# Patient Record
Sex: Male | Born: 1960 | Race: White | Hispanic: No | Marital: Married | Smoking: Never smoker
Health system: Southern US, Community
[De-identification: ages and names within clinical notes are randomized; demographics above are authoritative.]

## PROBLEM LIST (undated history)

## (undated) DIAGNOSIS — R030 Elevated blood-pressure reading, without diagnosis of hypertension: Secondary | ICD-10-CM

## (undated) DIAGNOSIS — E785 Hyperlipidemia, unspecified: Secondary | ICD-10-CM

## (undated) DIAGNOSIS — I10 Essential (primary) hypertension: Secondary | ICD-10-CM

## (undated) HISTORY — DX: Essential (primary) hypertension: I10

## (undated) HISTORY — DX: Hyperlipidemia, unspecified: E78.5

## (undated) HISTORY — PX: OTHER SURGICAL HISTORY: SHX169

---

## 2001-02-01 ENCOUNTER — Inpatient Hospital Stay (HOSPITAL_COMMUNITY): Admission: EM | Admit: 2001-02-01 | Discharge: 2001-02-02 | Payer: Self-pay | Admitting: Emergency Medicine

## 2001-02-01 ENCOUNTER — Encounter: Payer: Self-pay | Admitting: Emergency Medicine

## 2001-02-02 ENCOUNTER — Encounter: Payer: Self-pay | Admitting: Surgery

## 2001-02-09 ENCOUNTER — Ambulatory Visit (HOSPITAL_COMMUNITY): Admission: RE | Admit: 2001-02-09 | Discharge: 2001-02-09 | Payer: Self-pay | Admitting: Orthopaedic Surgery

## 2009-08-23 ENCOUNTER — Ambulatory Visit: Payer: Self-pay | Admitting: Interventional Radiology

## 2009-08-23 ENCOUNTER — Emergency Department (HOSPITAL_BASED_OUTPATIENT_CLINIC_OR_DEPARTMENT_OTHER): Admission: EM | Admit: 2009-08-23 | Discharge: 2009-08-23 | Payer: Self-pay | Admitting: Emergency Medicine

## 2011-03-25 NOTE — Op Note (Signed)
[facility]  Patient:    Garrett Rogers, Garrett Rogers                     MRN: [id number] Proc. Date: 02/09/01 Adm. Date:  [id number] Attending:  Jacki Cones                           Operative Report  PREOPERATIVE DIAGNOSIS:  Right distal radius styloid fracture.  POSTOPERATIVE DIAGNOSIS:  Right distal radius styloid fracture.  PROCEDURE:  Reduction and percutaneous screw fixation of distal radius styloid fracture.  SURGEON:  Mark C. Ophelia Charter, M.D.  ANESTHESIA:  General endotracheal.  TOURNIQUET TIME:  None.  DESCRIPTION OF PROCEDURE:  After general anesthesia, standard prepping and draping, with sterile gloves and lead shielding the patient and staff, the usual extremity sheets, drapes were applied.  C-arm was used after sterile draping for rotation of the wrist and visualization of the fracture site. Appropriate entry site for reduction was just volar to the first dorsal compartment.  A 1.5-2 cm incision was made, blunt dissection was performed, with mobilization to avoid damage to the vein as well as radial sensory nerve branches.  Once tiny retractors were placed and the distal radius was visualized, an 0.062 K-wire was drilled directly into the periosteum, into the bond fragment, and this was twisted, angled under fluoroscopy until the fracture was reduced nearly anatomic and then passed across the fracture.  A second pin, which was used for a 4.0 cannulated screw, was then placed more distal to this, drilled across the periosteum into the cortex, but not across the fracture site.  The first pin was then backed up and with the two pins in position, the fragment was rotated and gently reduced, checking under fluoroscopy until it was anatomic, and then the second pin, which was the pin for the cannulated screw, was then advanced across toward the opposite cortex. A 43 mm screw was selected.  The first 0.62 K-wire was removed and with  the fracture anatomic holding pressure, with tips of Adson pickups, the fracture was lagged across, compressed anatomically, and visualized under fluoroscopy and was anatomic.  After irrigation, two simple sutures were placed closing the skin, Xeroform, 4 x 4s, Webril, and the splint was reapplied to the wrist.  Instrument count and needle count were correct. DD:  02/09/01 TD:  02/09/01 Job: [id number]
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
## 2015-07-08 ENCOUNTER — Encounter (HOSPITAL_BASED_OUTPATIENT_CLINIC_OR_DEPARTMENT_OTHER): Payer: Self-pay | Admitting: Adult Health

## 2015-07-08 ENCOUNTER — Emergency Department (HOSPITAL_BASED_OUTPATIENT_CLINIC_OR_DEPARTMENT_OTHER)
Admission: EM | Admit: 2015-07-08 | Discharge: 2015-07-09 | Disposition: A | Discharge status: Home / Self Care | Payer: BLUE CROSS/BLUE SHIELD | Attending: Emergency Medicine | Admitting: Emergency Medicine

## 2015-07-08 ENCOUNTER — Emergency Department (HOSPITAL_BASED_OUTPATIENT_CLINIC_OR_DEPARTMENT_OTHER): Payer: BLUE CROSS/BLUE SHIELD

## 2015-07-08 DIAGNOSIS — R030 Elevated blood-pressure reading, without diagnosis of hypertension: Secondary | ICD-10-CM | POA: Insufficient documentation

## 2015-07-08 DIAGNOSIS — R51 Headache: Secondary | ICD-10-CM | POA: Insufficient documentation

## 2015-07-08 DIAGNOSIS — E669 Obesity, unspecified: Secondary | ICD-10-CM | POA: Insufficient documentation

## 2015-07-08 DIAGNOSIS — R519 Headache, unspecified: Secondary | ICD-10-CM

## 2015-07-08 HISTORY — DX: Elevated blood-pressure reading, without diagnosis of hypertension: R03.0

## 2015-07-08 LAB — URINALYSIS, ROUTINE W REFLEX MICROSCOPIC
Bilirubin Urine: NEGATIVE
Glucose, UA: NEGATIVE mg/dL
Hgb urine dipstick: NEGATIVE
Ketones, ur: NEGATIVE mg/dL
Leukocytes, UA: NEGATIVE
Nitrite: NEGATIVE
Protein, ur: NEGATIVE mg/dL
Specific Gravity, Urine: 1.01 (ref 1.005–1.030)
Urobilinogen, UA: 0.2 mg/dL (ref 0.0–1.0)
pH: 6.5 (ref 5.0–8.0)

## 2015-07-08 NOTE — ED Notes (Signed)
Pt c/o mild headache for the last three days with elevated BP.  Pt denies any SOB, denies chest pain or dizziness, no numbness or weakness or confusion.  Pt took Ibuprofen prior to arrival and verbalizes relief from head pain and declines anything further for pain.

## 2015-07-08 NOTE — ED Provider Notes (Signed)
CSN: [id number]     Arrival date & time 07/08/15  2111 History   This chart was scribed for Shon Baton, MD by Arlan Organ, ED Scribe. This patient was seen in room MH01/MH01 and the patient's care was started 11:17 PM.   Chief Complaint  Patient presents with  . Headache  . Hypertension   The history is provided by the patient. No language interpreter was used.    HPI Comments: Garrett Rogers is a 54 y.o. male with a PMHx of pre hypertension who presents to the Emergency Department complaining of an intermittent, ongoing, unchanged HA x 3 days. Pain is describes as "pressure to the back of my head" and rated pain 2/10 currently. Denies any history of HAs. OTC Ibuprofen attempted prior to arrival with mild improvement. Garrett Rogers also has some concern regarding his blood pressure as it was elevated at 150/90 earlier this afternoon. States he was diagnosed with pre-hypertension during last CPE. Pt admits to some sleep depravation in the last few days. He recently started a new job but denies any significant associated stress.  States he first noticed a headache on Sunday morning after he drank alcohol on Saturday night. Patient reports that he is most concerned because his mother had an aneurysm at age 59 and he is 64. He denies worst headache of his life or maximal headache at onset. Currently he denies any fever, chills, shortness of breath, dizziness, chest pain, or leg swelling. No weakness, numbness, or loss of sensation.  Past Medical History  Diagnosis Date  . Prehypertension    History reviewed. No pertinent past surgical history. History reviewed. No pertinent family history. Social History  Substance Use Topics  . Smoking status: Never Smoker   . Smokeless tobacco: None  . Alcohol Use: No    Review of Systems  Constitutional: Negative for fever and chills.  HENT: Negative for congestion.   Respiratory: Negative for cough and shortness of breath.   Cardiovascular:  Negative for chest pain and leg swelling.  Gastrointestinal: Negative for nausea, vomiting and abdominal pain.  Neurological: Positive for headaches. Negative for dizziness, weakness and numbness.  Psychiatric/Behavioral: Negative for confusion.  All other systems reviewed and are negative.     Allergies  Review of patient's allergies indicates no known allergies.  Home Medications   Prior to Admission medications   Not on File   Triage Vitals: BP 144/83 mmHg  Pulse 60  Temp(Src) 98.5 F (36.9 C) (Oral)  Resp 16  Ht 6' (1.829 m)  Wt 306 lb 2 oz (138.857 kg)  BMI 41.51 kg/m2  SpO2 98%   Physical Exam  Constitutional: He is oriented to person, place, and time. He appears well-developed and well-nourished.  Obese  HENT:  Head: Normocephalic and atraumatic.  Eyes: Pupils are equal, round, and reactive to light.  Cardiovascular: Normal rate, regular rhythm and normal heart sounds.   No murmur heard. Pulmonary/Chest: Effort normal and breath sounds normal. No respiratory distress. He has no wheezes.  Abdominal: Soft. Bowel sounds are normal. There is no tenderness. There is no rebound.  Musculoskeletal: He exhibits no edema.  Neurological: He is alert and oriented to person, place, and time.  Cranial nerves II through XII intact, 5 out of 5 strength in all 4 extremities, no dysmetria to finger-nose-finger  Skin: Skin is warm and dry.  Psychiatric: He has a normal mood and affect.  Nursing note and vitals reviewed.   ED Course  Procedures (including critical care time)  DIAGNOSTIC STUDIES: Oxygen Saturation is 97% on RA, Normal by my interpretation.    COORDINATION OF CARE: 11:22 PM- Will order head CT and urinalysis. Discussed treatment plan with pt at bedside and pt agreed to plan.     Labs Review Labs Reviewed  URINALYSIS, ROUTINE W REFLEX MICROSCOPIC (NOT AT [facility])    Imaging Review Ct Head Wo Contrast  07/09/2015   CLINICAL DATA:  Acute onset of occipital  headache for 3 days. Initial encounter.  EXAM: CT HEAD WITHOUT CONTRAST  TECHNIQUE: Contiguous axial images were obtained from the base of the skull through the vertex without intravenous contrast.  COMPARISON:  None.  FINDINGS: There is no evidence of acute infarction, mass lesion, or intra- or extra-axial hemorrhage on CT.  Mild periventricular white matter change likely reflects small vessel ischemic microangiopathy.  The posterior fossa, including the cerebellum, brainstem and fourth ventricle, is within normal limits. The third and lateral ventricles, and basal ganglia are unremarkable in appearance. The cerebral hemispheres are symmetric in appearance, with normal gray-white differentiation. No mass effect or midline shift is seen.  There is no evidence of fracture; visualized osseous structures are unremarkable in appearance. The orbits are within normal limits. There is partial opacification of the right mastoid air cells. The paranasal sinuses and left mastoid air cells are well-aerated. No significant soft tissue abnormalities are seen.  IMPRESSION: 1. No acute intracranial pathology seen on CT. 2. Mild small vessel ischemic microangiopathy. 3. Partial opacification of the right mastoid air cells.   Electronically Signed   By: Roanna Raider M.D.   On: 07/09/2015 00:04   I have personally reviewed and evaluated these images and lab results as part of my medical decision-making.   EKG Interpretation None      MDM   Final diagnoses:  Nonintractable episodic headache, unspecified headache type  Prehypertension    Patient presents with headache. This is in the setting of high blood pressure readings at home. Denies worst headache of his life or fevers. Does have a family history of cerebral aneurysms and this is his concern. Patient has multiple factors that can contribute to headache including sleep deprivation and alcohol use. His headache is currently 2 out of 10. He is nonfocal. Low  suspicion at this time for subarachnoid hemorrhage as the cause. However, given the patient's concern, will obtain a screening head CT to evaluate for blood. This is negative. Discussed the results with the patient and his wife. He was reassured. I did discuss with them that a head CT does not definitively rule out a vascular aneurysm but is reassuring that there is not any acute bleed.  Patient was given strict return precautions.  Patient's blood pressure improved to the 130s over 70s while in the emergency room. No indication for blood pressure management at this time. Follow-up with primary physician.  After history, exam, and medical workup I feel the patient has been appropriately medically screened and is safe for discharge home. Pertinent diagnoses were discussed with the patient. Patient was given return precautions.  I personally performed the services described in this documentation, which was scribed in my presence. The recorded information has been reviewed and is accurate.   Shon Baton, MD 07/09/15 [phone]

## 2015-07-08 NOTE — ED Notes (Addendum)
Presents with headache and hypertension over 150/90 for past 3 days-hx of prehypertension. BP here is 170/93. denies SOB, chest pain and dizziness, denies confusion alert and oriented. Headache is rated 4/10-took Ibuprofen for pain with relief

## 2015-07-08 NOTE — ED Notes (Signed)
MD at bedside. 

## 2015-07-08 NOTE — ED Notes (Signed)
Pt declines anything to eat or drink at this time

## 2015-07-08 NOTE — ED Notes (Signed)
Patient transported to CT 

## 2015-07-08 NOTE — ED Notes (Signed)
Pt returned from CT °

## 2015-07-09 NOTE — ED Notes (Signed)
Pt verbalizes understanding of d/c instructions and denies any further need at this time. 

## 2015-07-09 NOTE — Discharge Instructions (Signed)
You were seen today for headache. Your blood pressure was initially high. High blood pressure can cause headache but your blood pressure normalized while in the emergency room. Other factors include fatigue, alcohol, and tension. Your CT scan is negative. If he continues to have headaches, you need to follow-up with Your primary physician for further evaluation.  General Headache Without Cause A headache is pain or discomfort felt around the head or neck area. The specific cause of a headache may not be found. There are many causes and types of headaches. A few common ones are:  Tension headaches.  Migraine headaches.  Cluster headaches.  Chronic daily headaches. HOME CARE INSTRUCTIONS   Keep all follow-up appointments with your caregiver or any specialist referral.  Only take over-the-counter or prescription medicines for pain or discomfort as directed by your caregiver.  Lie down in a dark, quiet room when you have a headache.  Keep a headache journal to find out what may trigger your migraine headaches. For example, write down:  What you eat and drink.  How much sleep you get.  Any change to your diet or medicines.  Try massage or other relaxation techniques.  Put ice packs or heat on the head and neck. Use these 3 to 4 times per day for 15 to 20 minutes each time, or as needed.  Limit stress.  Sit up straight, and do not tense your muscles.  Quit smoking if you smoke.  Limit alcohol use.  Decrease the amount of caffeine you drink, or stop drinking caffeine.  Eat and sleep on a regular schedule.  Get 7 to 9 hours of sleep, or as recommended by your caregiver.  Keep lights dim if bright lights bother you and make your headaches worse. SEEK MEDICAL CARE IF:   You have problems with the medicines you were prescribed.  Your medicines are not working.  You have a change from the usual headache.  You have nausea or vomiting. SEEK IMMEDIATE MEDICAL CARE IF:    Your headache becomes severe.  You have a fever.  You have a stiff neck.  You have loss of vision.  You have muscular weakness or loss of muscle control.  You start losing your balance or have trouble walking.  You feel faint or pass out.  You have severe symptoms that are different from your first symptoms. MAKE SURE YOU:   Understand these instructions.  Will watch your condition.  Will get help right away if you are not doing well or get worse. Document Released: 10/24/2005 Document Revised: 01/16/2012 Document Reviewed: 11/09/2011 [facility] Patient Information 2015 [place], [place]. This information is not intended to replace advice given to you by your health care provider. Make sure you discuss any questions you have with your health care provider.

## 2016-04-21 DIAGNOSIS — M7662 Achilles tendinitis, left leg: Secondary | ICD-10-CM | POA: Diagnosis not present

## 2016-04-21 DIAGNOSIS — Z125 Encounter for screening for malignant neoplasm of prostate: Secondary | ICD-10-CM | POA: Diagnosis not present

## 2016-04-21 DIAGNOSIS — Z131 Encounter for screening for diabetes mellitus: Secondary | ICD-10-CM | POA: Diagnosis not present

## 2016-04-21 DIAGNOSIS — I10 Essential (primary) hypertension: Secondary | ICD-10-CM | POA: Diagnosis not present

## 2016-04-21 DIAGNOSIS — Z0000 Encounter for general adult medical examination without abnormal findings: Secondary | ICD-10-CM | POA: Diagnosis not present

## 2016-04-21 DIAGNOSIS — Z13220 Encounter for screening for lipoid disorders: Secondary | ICD-10-CM | POA: Diagnosis not present

## 2017-07-07 DIAGNOSIS — Z86010 Personal history of colonic polyps: Secondary | ICD-10-CM | POA: Diagnosis not present

## 2017-07-07 DIAGNOSIS — K5730 Diverticulosis of large intestine without perforation or abscess without bleeding: Secondary | ICD-10-CM | POA: Diagnosis not present

## 2018-01-10 DIAGNOSIS — Z0000 Encounter for general adult medical examination without abnormal findings: Secondary | ICD-10-CM | POA: Diagnosis not present

## 2018-01-10 DIAGNOSIS — Z131 Encounter for screening for diabetes mellitus: Secondary | ICD-10-CM | POA: Diagnosis not present

## 2018-01-10 DIAGNOSIS — Z125 Encounter for screening for malignant neoplasm of prostate: Secondary | ICD-10-CM | POA: Diagnosis not present

## 2018-01-10 DIAGNOSIS — Z13220 Encounter for screening for lipoid disorders: Secondary | ICD-10-CM | POA: Diagnosis not present

## 2018-01-10 DIAGNOSIS — I10 Essential (primary) hypertension: Secondary | ICD-10-CM | POA: Diagnosis not present

## 2018-02-01 DIAGNOSIS — N183 Chronic kidney disease, stage 3 (moderate): Secondary | ICD-10-CM | POA: Diagnosis not present

## 2018-06-18 DIAGNOSIS — E7800 Pure hypercholesterolemia, unspecified: Secondary | ICD-10-CM | POA: Diagnosis not present

## 2018-08-10 ENCOUNTER — Ambulatory Visit
Admission: RE | Admit: 2018-08-10 | Discharge: 2018-08-10 | Disposition: A | Discharge status: Home / Self Care | Payer: BLUE CROSS/BLUE SHIELD | Source: Ambulatory Visit | Attending: Family Medicine | Admitting: Family Medicine

## 2018-08-10 ENCOUNTER — Other Ambulatory Visit: Payer: Self-pay | Admitting: Family Medicine

## 2018-08-10 DIAGNOSIS — R062 Wheezing: Secondary | ICD-10-CM | POA: Diagnosis not present

## 2018-08-10 DIAGNOSIS — R059 Cough, unspecified: Secondary | ICD-10-CM

## 2018-08-10 DIAGNOSIS — R05 Cough: Secondary | ICD-10-CM

## 2018-08-27 DIAGNOSIS — R05 Cough: Secondary | ICD-10-CM | POA: Diagnosis not present

## 2021-06-21 ENCOUNTER — Other Ambulatory Visit: Payer: Self-pay

## 2021-06-21 ENCOUNTER — Emergency Department (HOSPITAL_BASED_OUTPATIENT_CLINIC_OR_DEPARTMENT_OTHER): Payer: 59 | Admitting: Radiology

## 2021-06-21 ENCOUNTER — Emergency Department (HOSPITAL_BASED_OUTPATIENT_CLINIC_OR_DEPARTMENT_OTHER)
Admission: EM | Admit: 2021-06-21 | Discharge: 2021-06-21 | Disposition: A | Discharge status: Home / Self Care | Payer: 59 | Attending: Emergency Medicine | Admitting: Emergency Medicine

## 2021-06-21 ENCOUNTER — Encounter (HOSPITAL_BASED_OUTPATIENT_CLINIC_OR_DEPARTMENT_OTHER): Payer: Self-pay | Admitting: *Deleted

## 2021-06-21 DIAGNOSIS — S3992XA Unspecified injury of lower back, initial encounter: Secondary | ICD-10-CM | POA: Diagnosis present

## 2021-06-21 DIAGNOSIS — X58XXXA Exposure to other specified factors, initial encounter: Secondary | ICD-10-CM | POA: Insufficient documentation

## 2021-06-21 DIAGNOSIS — S39012A Strain of muscle, fascia and tendon of lower back, initial encounter: Secondary | ICD-10-CM | POA: Insufficient documentation

## 2021-06-21 MED ORDER — KETOROLAC TROMETHAMINE 30 MG/ML IJ SOLN
30.0000 mg | Freq: Once | INTRAMUSCULAR | Status: AC
  Administered 2021-06-21: 30 mg via INTRAMUSCULAR
  Filled 2021-06-21: qty 1

## 2021-06-21 MED ORDER — CYCLOBENZAPRINE HCL 10 MG PO TABS: 10.0000 mg | ORAL_TABLET | Freq: Two times a day (BID) | ORAL | 0 refills | Status: DC | PRN

## 2021-06-21 MED ORDER — PREDNISONE 10 MG (21) PO TBPK: ORAL_TABLET | Freq: Every day | ORAL | 0 refills | Status: DC

## 2021-06-21 NOTE — ED Triage Notes (Signed)
Tweaked  back getting out of bed last Tuesday or Wednesday, lower back pain continues.
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
## 2021-06-21 NOTE — ED Notes (Signed)
States got out of bed last week and felt pain left side lower back radiates down back of left leg to knee.  States difficult to get up or down (changing positions) due to pain.  Had similar pain in past but usually goes away in a couple of days

## 2021-06-21 NOTE — Discharge Instructions (Addendum)
Please follow-up with your primary care doctor and with the spine specialist.  Come back to the emergency room if you develop uncontrolled pain, numbness, weakness, bladder or bowel incontinence, urinary retention or other new concerning symptom.  Recommend taking the course of steroids.  Can additionally take anti-inflammatories as needed, Tylenol as needed and the muscle relaxer as needed.  Note the muscle relaxer can make you drowsy and should not be taken while driving or operating heavy machinery.

## 2022-12-10 IMAGING — DX DG LUMBAR SPINE COMPLETE 4+V
5 series · 5 of 5 positions shown · non-contrast
Comparison: None.

CLINICAL DATA: Low back pain.

EXAM:
LUMBAR SPINE - COMPLETE 4+ VIEW

[l-spine ap]
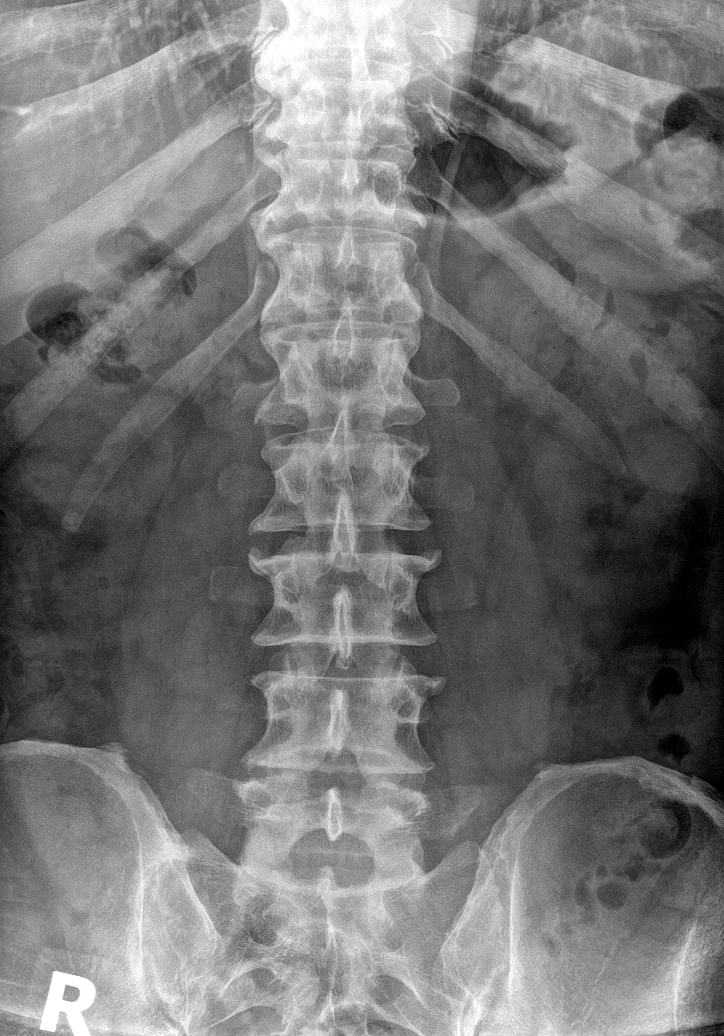

[l-spine obl (1 of 2)]
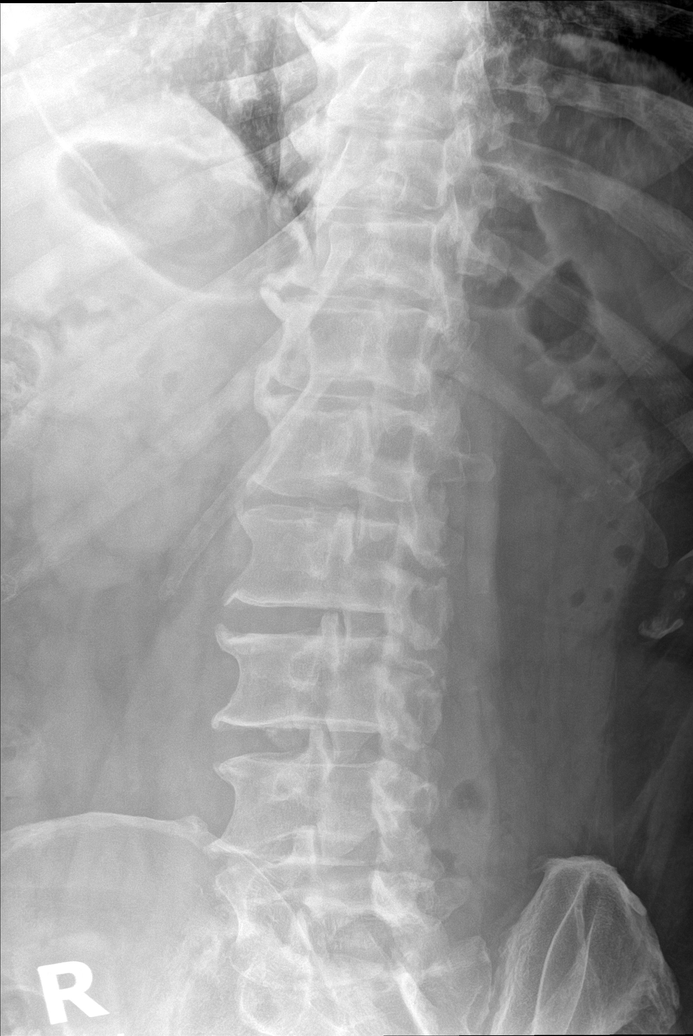

[l-spine obl (2 of 2)]
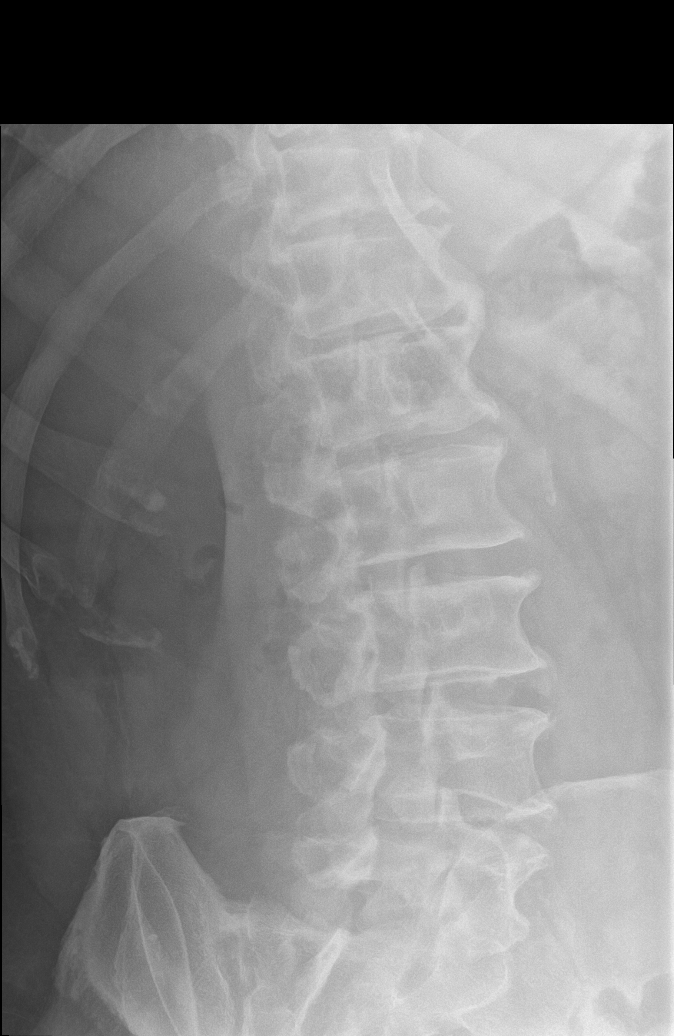

[l-spine lat]
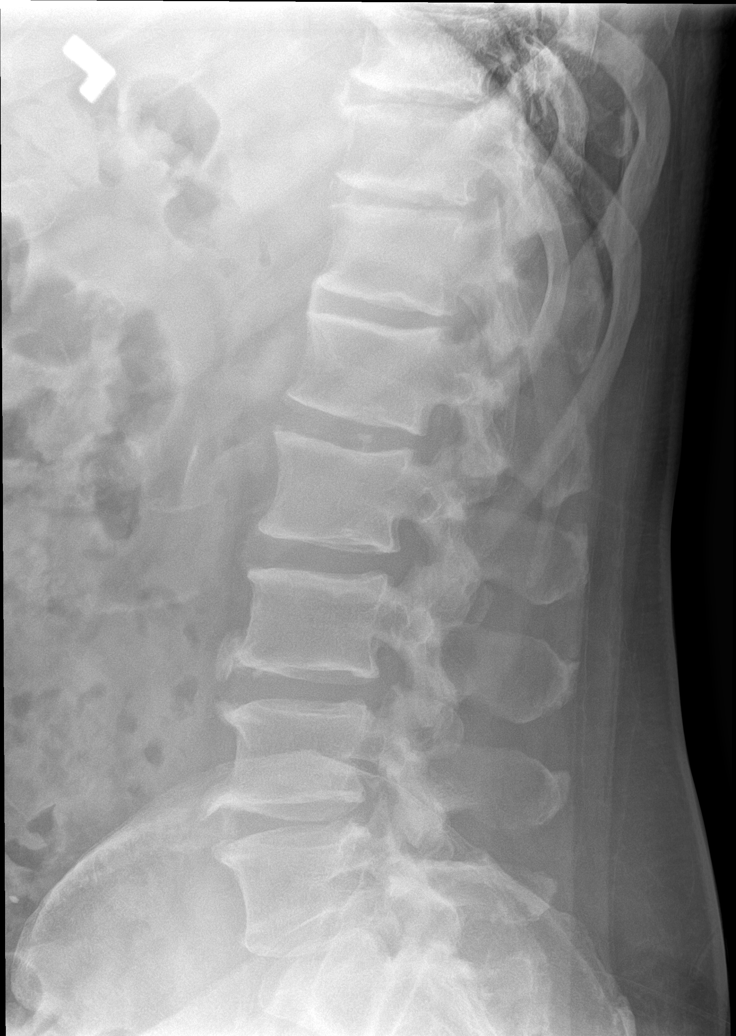

[l-spine spot]
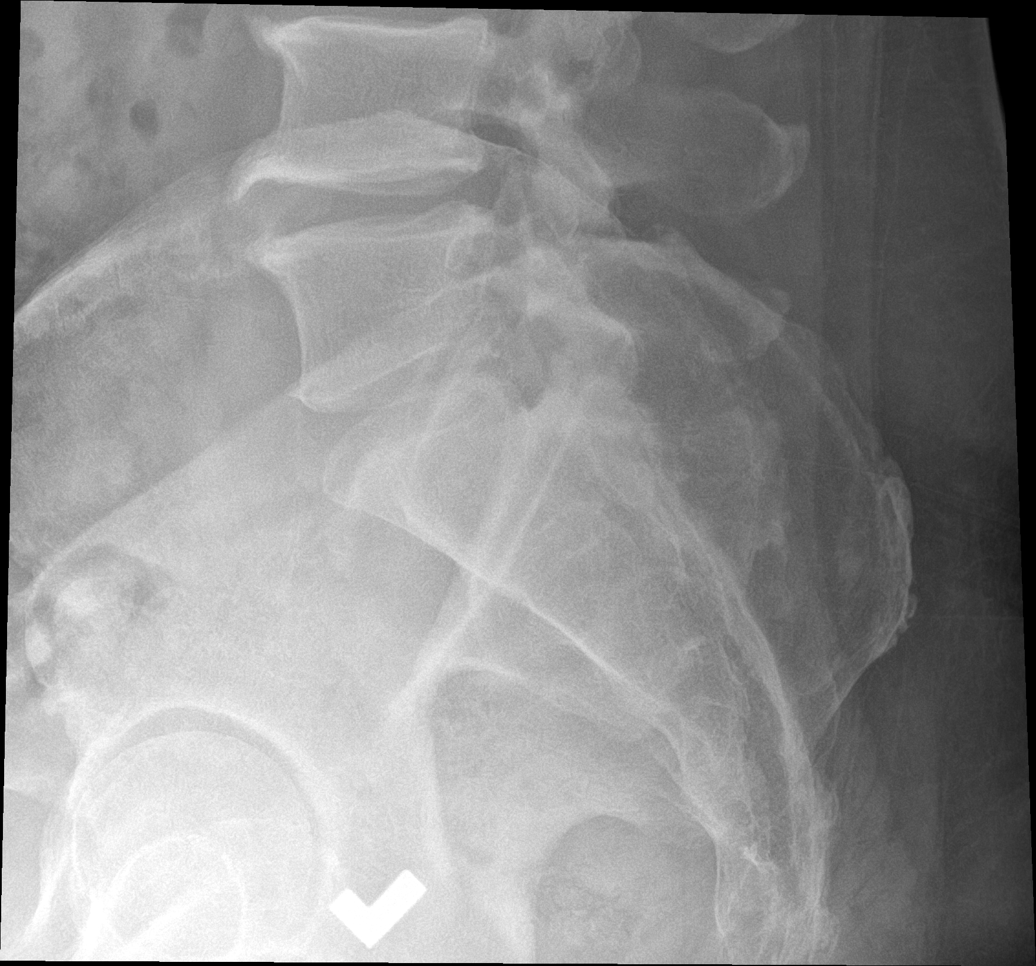

[5 of 5 positions shown; findings below may reference images not displayed]

FINDINGS: Normal alignment of vertebral bodies. Endplate spurring from L3-L5.
No loss of vertebral body height or disc height. No subluxation. No
pars fracture
IMPRESSION: Endplate osteophytosis.  No acute findings

## 2023-08-21 NOTE — Progress Notes (Signed)
Referring-chest pain Reason for referral-Dibas Koirala MD  HPI: 62 year old male for evaluation of chest pain at request of Dibas Koirala MD. Seen recently by primary care and noted chest tightness.  Most recent LDL 118.  Approximately 1 month ago the patient was mowing his yard and developed chest tightness associated with heart racing.  This was in his mid upper chest area without other radiation.  There was dyspnea but no nausea or diaphoresis.  He rested and his symptoms improved after 30 minutes.  He started mowing again and had a second episode that was less severe.  Since then he denies dyspnea on exertion, orthopnea, PND, pedal edema or syncope.  He has not had any further chest pain.  Cardiology now asked to evaluate.  Current Outpatient Medications  Medication Sig Dispense Refill   hydrochlorothiazide (HYDRODIURIL) 25 MG tablet Take 25 mg by mouth every morning.     No current facility-administered medications for this visit.    No Known Allergies   Past Medical History:  Diagnosis Date   Hyperlipidemia    Hypertension     Past Surgical History:  Procedure Laterality Date   Right wrist fracture      Social History   Socioeconomic History   Marital status: Married    Spouse name: Not on file   Number of children: 3   Years of education: Not on file   Highest education level: Not on file  Occupational History   Not on file  Tobacco Use   Smoking status: Never    Passive exposure: Never   Smokeless tobacco: Never  Vaping Use   Vaping status: Never Used  Substance and Sexual Activity   Alcohol use: Yes    Comment: Occasional   Drug use: No   Sexual activity: Not on file  Other Topics Concern   Not on file  Social History Narrative   Not on file   Social Determinants of Health   Financial Resource Strain: Not on file  Food Insecurity: Not on file  Transportation Needs: Not on file  Physical Activity: Not on file  Stress: Not on file  Social  Connections: Not on file  Intimate Partner Violence: Not on file    Family History  Problem Relation Age of Onset   COPD Father     ROS: no fevers or chills, productive cough, hemoptysis, dysphasia, odynophagia, melena, hematochezia, dysuria, hematuria, rash, seizure activity, orthopnea, PND, pedal edema, claudication. Remaining systems are negative.  Physical Exam:   Blood pressure 118/70, pulse 71, height 6' (1.829 m), weight 275 lb 3.2 oz (124.8 kg), SpO2 95%.  General:  Well developed/well nourished in NAD Skin warm/dry Patient not depressed No peripheral clubbing Back-normal HEENT-normal/normal eyelids Neck supple/normal carotid upstroke bilaterally; no bruits; no JVD; no thyromegaly chest - CTA/ normal expansion CV - RRR/normal S1 and S2; no murmurs, rubs or gallops;  PMI nondisplaced Abdomen -NT/ND, no HSM, no mass, + bowel sounds, no bruit 2+ femoral pulses, no bruits Ext-no edema, chords, 2+ DP Neuro-grossly nonfocal  ECG -September 2024-normal sinus rhythm with no ST changes.  Personally reviewed  EKG Interpretation Date/Time:  Monday September 04 2023 08:32:08 EDT Ventricular Rate:  69 PR Interval:  156 QRS Duration:  86 QT Interval:  384 QTC Calculation: 411 R Axis:   51  Text Interpretation: Normal sinus rhythm Normal ECG When compared with ECG of 08-Feb-2001 13:21, No significant change was found Confirmed by Olga Millers ([id number]) on 09/04/2023 8:37:29 AM  A/P  1 chest pain-symptoms have both typical and atypical features.  Electrocardiogram is normal.  I will arrange a coronary CTA to rule out obstructive coronary disease.  2 hypertension-blood pressure is controlled.  Continue present medical regimen.  3 hyperlipidemia-mild.  If he has coronary calcification or obstruction we will need to initiate statin therapy.  Olga Millers, MD

## 2023-09-04 ENCOUNTER — Encounter: Payer: Self-pay | Admitting: Cardiology

## 2023-09-04 ENCOUNTER — Ambulatory Visit: Payer: 59 | Attending: Cardiology | Admitting: Cardiology

## 2023-09-04 VITALS — BP 118/70 | HR 71 | Ht 72.0 in | Wt 275.2 lb

## 2023-09-04 DIAGNOSIS — E7800 Pure hypercholesterolemia, unspecified: Secondary | ICD-10-CM

## 2023-09-04 DIAGNOSIS — R0789 Other chest pain: Secondary | ICD-10-CM | POA: Diagnosis not present

## 2023-09-04 DIAGNOSIS — I10 Essential (primary) hypertension: Secondary | ICD-10-CM | POA: Diagnosis not present

## 2023-09-04 MED ORDER — METOPROLOL TARTRATE 100 MG PO TABS: ORAL_TABLET | ORAL | 0 refills | Status: DC

## 2023-09-04 NOTE — Patient Instructions (Addendum)
Testing/Procedures:    Your cardiac CT will be scheduled at   [facility] [address] [place], [place] [address] [phone]   If scheduled at [facility], please arrive at the [facility] (Entrance C2) of [facility] 30 minutes prior to test start time. You can use the FREE valet parking offered at entrance C (encouraged to control the heart rate for the test)  Proceed to the [facility] Radiology Department (first floor) to check-in and test prep.  All radiology patients and guests should use entrance C2 at [facility], accessed from [facility], even though the hospital's physical address listed is [address].     Please follow these instructions carefully (unless otherwise directed):  An IV will be required for this test and Nitroglycerin will be given.  Hold all erectile dysfunction medications at least 3 days (72 hrs) prior to test. (Ie viagra, cialis, sildenafil, tadalafil, etc)   On the Night Before the Test: Be sure to Drink plenty of water. Do not consume any caffeinated/decaffeinated beverages or chocolate 12 hours prior to your test. Do not take any antihistamines 12 hours prior to your test.  On the Day of the Test: Drink plenty of water until 1 hour prior to the test. Do not eat any food 1 hour prior to test. You may take your regular medications prior to the test.  Take metoprolol (Lopressor) 100 mg two hours prior to test. If you take Hydrochlorothiazide, please HOLD on the morning of the test.   After the Test: Drink plenty of water. After receiving IV contrast, you may experience a mild flushed feeling. This is normal. On occasion, you may experience a mild rash up to 24 hours after the test. This is not dangerous. If this occurs, you can take Benadryl 25 mg and increase your fluid intake. If you experience trouble breathing, this can be serious. If it is severe call 911  IMMEDIATELY. If it is mild, please call our office. We will call to schedule your test 2-4 weeks out understanding that some insurance companies will need an authorization prior to the service being performed.   For more information and frequently asked questions, please visit our website : [email or web address]  For non-scheduling related questions, please contact the cardiac imaging nurse navigator should you have any questions/concerns: Cardiac Imaging Nurse Navigators Direct Office Dial: [phone]   For scheduling needs, including cancellations and rescheduling, please call [place], [phone].   Follow-Up: At [facility], you and your health needs are our priority.  As part of our continuing mission to provide you with exceptional heart care, we have created designated Provider Care Teams.  These Care Teams include your primary Cardiologist (physician) and Advanced Practice Providers (APPs -  Physician Assistants and Nurse Practitioners) who all work together to provide you with the care you need, when you need it.  We recommend signing up for the patient portal called "MyChart".  Sign up information is provided on this After Visit Summary.  MyChart is used to connect with patients for Virtual Visits (Telemedicine).  Patients are able to view lab/test results, encounter notes, upcoming appointments, etc.  Non-urgent messages can be sent to your provider as well.   To learn more about what you can do with MyChart, go to [email or web address].    Your next appointment:   12 month(s)  Provider:   Olga Millers MD

## 2023-09-20 ENCOUNTER — Ambulatory Visit (HOSPITAL_COMMUNITY): Payer: 59

## 2024-05-23 DIAGNOSIS — R1030 Lower abdominal pain, unspecified: Secondary | ICD-10-CM | POA: Diagnosis not present

## 2024-05-23 DIAGNOSIS — R5383 Other fatigue: Secondary | ICD-10-CM | POA: Diagnosis not present

## 2024-05-23 DIAGNOSIS — R319 Hematuria, unspecified: Secondary | ICD-10-CM | POA: Diagnosis not present

## 2024-05-23 DIAGNOSIS — R6883 Chills (without fever): Secondary | ICD-10-CM | POA: Diagnosis not present

## 2024-06-13 DIAGNOSIS — R109 Unspecified abdominal pain: Secondary | ICD-10-CM | POA: Diagnosis not present

## 2024-06-13 DIAGNOSIS — I10 Essential (primary) hypertension: Secondary | ICD-10-CM | POA: Diagnosis not present

## 2024-06-20 DIAGNOSIS — R109 Unspecified abdominal pain: Secondary | ICD-10-CM | POA: Diagnosis not present

## 2024-06-23 DIAGNOSIS — J069 Acute upper respiratory infection, unspecified: Secondary | ICD-10-CM | POA: Diagnosis not present

## 2024-06-23 DIAGNOSIS — Z136 Encounter for screening for cardiovascular disorders: Secondary | ICD-10-CM | POA: Diagnosis not present

## 2024-06-23 DIAGNOSIS — I498 Other specified cardiac arrhythmias: Secondary | ICD-10-CM | POA: Diagnosis not present

## 2024-07-12 DIAGNOSIS — Z03818 Encounter for observation for suspected exposure to other biological agents ruled out: Secondary | ICD-10-CM | POA: Diagnosis not present

## 2024-07-12 DIAGNOSIS — R059 Cough, unspecified: Secondary | ICD-10-CM | POA: Diagnosis not present

## 2024-07-25 DIAGNOSIS — R059 Cough, unspecified: Secondary | ICD-10-CM | POA: Diagnosis not present

## 2024-08-15 DIAGNOSIS — Z0000 Encounter for general adult medical examination without abnormal findings: Secondary | ICD-10-CM | POA: Diagnosis not present

## 2024-11-26 ENCOUNTER — Telehealth: Payer: Self-pay | Admitting: Cardiology

## 2024-11-26 NOTE — Telephone Encounter (Signed)
 Pt would like to have CT rescheduled but needs a new order in, please advise.

## 2024-12-04 NOTE — Telephone Encounter (Signed)
 Spoke with pt, Follow up scheduled

## 2024-12-11 NOTE — Progress Notes (Unsigned)
" ° ° ° °  HPI: Follow-up chest pain.  Last seen October 2024.  CTA ordered at that time but not performed.  Since last seen  Current Outpatient Medications  Medication Sig Dispense Refill   hydrochlorothiazide (HYDRODIURIL) 25 MG tablet Take 25 mg by mouth every morning.     metoprolol  tartrate (LOPRESSOR ) 100 MG tablet Take 2 hours prior to CT Scan. 1 tablet 0   No current facility-administered medications for this visit.     Past Medical History:  Diagnosis Date   Hyperlipidemia    Hypertension     Past Surgical History:  Procedure Laterality Date   Right wrist fracture      Social History   Socioeconomic History   Marital status: Married    Spouse name: Not on file   Number of children: 3   Years of education: Not on file   Highest education level: Not on file  Occupational History   Not on file  Tobacco Use   Smoking status: Never    Passive exposure: Never   Smokeless tobacco: Never  Vaping Use   Vaping status: Never Used  Substance and Sexual Activity   Alcohol use: Yes    Comment: Occasional   Drug use: No   Sexual activity: Not on file  Other Topics Concern   Not on file  Social History Narrative   Not on file   Social Drivers of Health   Tobacco Use: Low Risk (09/04/2023)   Patient History    Smoking Tobacco Use: Never    Smokeless Tobacco Use: Never    Passive Exposure: Never  Financial Resource Strain: Not on file  Food Insecurity: Not on file  Transportation Needs: Not on file  Physical Activity: Not on file  Stress: Not on file  Social Connections: Not on file  Intimate Partner Violence: Not on file  Depression (EYV7-0): Not on file  Alcohol Screen: Not on file  Housing: Not on file  Utilities: Not on file  Health Literacy: Not on file    Family History  Problem Relation Age of Onset   COPD Father     ROS: no fevers or chills, productive cough, hemoptysis, dysphasia, odynophagia, melena, hematochezia, dysuria, hematuria, rash,  seizure activity, orthopnea, PND, pedal edema, claudication. Remaining systems are negative.  Physical Exam: Well-developed well-nourished in no acute distress.  Skin is warm and dry.  HEENT is normal.  Neck is supple.  Chest is clear to auscultation with normal expansion.  Cardiovascular exam is regular rate and rhythm.  Abdominal exam nontender or distended. No masses palpated. Extremities show no edema. neuro grossly intact  ECG- personally reviewed  A/P  1 chest pain-  2 hypertension-patient's blood pressure is controlled.  Continue present medications.  3 history of mild hyperlipidemia-  Redell Shallow, MD    "

## 2024-12-12 ENCOUNTER — Ambulatory Visit: Payer: Self-pay | Admitting: Cardiology

## 2024-12-12 ENCOUNTER — Encounter: Payer: Self-pay | Admitting: Cardiology

## 2024-12-12 VITALS — BP 154/70 | HR 78 | Ht 72.0 in | Wt 307.0 lb

## 2024-12-12 DIAGNOSIS — E7800 Pure hypercholesterolemia, unspecified: Secondary | ICD-10-CM

## 2024-12-12 DIAGNOSIS — I10 Essential (primary) hypertension: Secondary | ICD-10-CM | POA: Diagnosis not present

## 2024-12-12 DIAGNOSIS — R0609 Other forms of dyspnea: Secondary | ICD-10-CM

## 2024-12-12 MED ORDER — METOPROLOL TARTRATE 100 MG PO TABS: ORAL_TABLET | ORAL | 0 refills | Status: AC

## 2024-12-12 NOTE — Patient Instructions (Addendum)
 "  Testing/Procedures:  Your physician has requested that you have an echocardiogram. Echocardiography is a painless test that uses sound waves to create images of your heart. It provides your doctor with information about the size and shape of your heart and how well your hearts chambers and valves are working. This procedure takes approximately one hour. There are no restrictions for this procedure. Please do NOT wear cologne, perfume, aftershave, or lotions (deodorant is allowed). Please arrive 15 minutes prior to your appointment time.  Please note: We ask at that you not bring children with you during ultrasound (echo/ vascular) testing. Due to room size and safety concerns, children are not allowed in the ultrasound rooms during exams. Our front office staff cannot provide observation of children in our lobby area while testing is being conducted. An adult accompanying a patient to their appointment will only be allowed in the ultrasound room at the discretion of the ultrasound technician under special circumstances. We apologize for any inconvenience. [address]  Follow-Up: At [facility], you and your health needs are our priority.  As part of our continuing mission to provide you with exceptional heart care, our providers are all part of one team.  This team includes your primary Cardiologist (physician) and Advanced Practice Providers or APPs (Physician Assistants and Nurse Practitioners) who all work together to provide you with the care you need, when you need it.  Your next appointment:   12 month(s)  Provider:   REDELL SHALLOW MD    Other Instructions   Your cardiac CT will be scheduled at   [facility] [address]  [place], [place] [address]   If scheduled at the Heart and Vascular Tower at [address], please enter the parking lot using the [address] entrance and use the FREE valet service at the patient drop-off  area. Enter the building and check-in with registration on the main floor.  Please follow these instructions carefully (unless otherwise directed):  An IV will be required for this test and Nitroglycerin will be given.  Hold all erectile dysfunction medications at least 3 days (72 hrs) prior to test. (Ie viagra, cialis, sildenafil, tadalafil, etc)   On the Night Before the Test: Be sure to Drink plenty of water. Do not consume any caffeinated/decaffeinated beverages or chocolate 12 hours prior to your test. Do not take any antihistamines 12 hours prior to your test.  On the Day of the Test: Drink plenty of water until 1 hour prior to the test. Do not eat any food 1 hour prior to test. You may take your regular medications prior to the test.  Take metoprolol  (Lopressor ) 100 MG two hours prior to test. If you take Hydrochlorothiazide please HOLD on the morning of the test. Patients who wear a continuous glucose monitor MUST remove the device prior to scanning.      After the Test: Drink plenty of water. After receiving IV contrast, you may experience a mild flushed feeling. This is normal. On occasion, you may experience a mild rash up to 24 hours after the test. This is not dangerous. If this occurs, you can take Benadryl 25 mg, Zyrtec, Claritin, or Allegra and increase your fluid intake. (Patients taking Tikosyn should avoid Benadryl, and may take Zyrtec, Claritin, or Allegra) If you experience trouble breathing, this can be serious. If it is severe call 911 IMMEDIATELY. If it is mild, please call our office.  We will call to schedule your  test 2-4 weeks out understanding that some insurance companies will need an authorization prior to the service being performed.   For more information and frequently asked questions, please visit our website : [email or web address]  For non-scheduling related questions, please contact the cardiac imaging nurse navigator should you have any  questions/concerns: Cardiac Imaging Nurse Navigators Direct Office Dial: [phone]   For scheduling needs, including cancellations and rescheduling, please call [place], [phone].  For billing questions, please call [phone].            "

## 2024-12-12 NOTE — Addendum Note (Signed)
 Addended by: Ethel Veronica W on: 12/12/2024 10:09 AM   Modules accepted: Orders

## 2024-12-13 ENCOUNTER — Ambulatory Visit: Payer: Self-pay | Admitting: Physician Assistant

## 2025-01-23 ENCOUNTER — Ambulatory Visit (HOSPITAL_COMMUNITY)

## 2025-02-12 ENCOUNTER — Ambulatory Visit: Payer: Self-pay | Admitting: Physician Assistant
